# Patient Record
Sex: Male | Born: 1985 | Race: Black or African American | Hispanic: No | Marital: Single | Smoking: Current every day smoker
Health system: Southern US, Community
[De-identification: ages and names within clinical notes are randomized; demographics above are authoritative.]

## PROBLEM LIST (undated history)

## (undated) HISTORY — PX: COLOSTOMY: SHX63

---

## 2018-11-26 ENCOUNTER — Encounter (HOSPITAL_COMMUNITY): Payer: Self-pay | Admitting: Emergency Medicine

## 2018-11-26 ENCOUNTER — Other Ambulatory Visit: Payer: Self-pay

## 2018-11-26 ENCOUNTER — Emergency Department (HOSPITAL_COMMUNITY)
Admission: EM | Admit: 2018-11-26 | Discharge: 2018-11-26 | Disposition: A | Discharge status: Home / Self Care | Payer: Self-pay | Attending: Emergency Medicine | Admitting: Emergency Medicine

## 2018-11-26 DIAGNOSIS — F17210 Nicotine dependence, cigarettes, uncomplicated: Secondary | ICD-10-CM | POA: Insufficient documentation

## 2018-11-26 DIAGNOSIS — A5900 Urogenital trichomoniasis, unspecified: Secondary | ICD-10-CM | POA: Insufficient documentation

## 2018-11-26 DIAGNOSIS — Z202 Contact with and (suspected) exposure to infections with a predominantly sexual mode of transmission: Secondary | ICD-10-CM

## 2018-11-26 MED ORDER — ONDANSETRON 4 MG PO TBDP: 4.0000 mg | ORAL_TABLET | Freq: Three times a day (TID) | ORAL | 0 refills | Status: AC | PRN

## 2018-11-26 MED ORDER — METRONIDAZOLE 500 MG PO TABS
2000.0000 mg | ORAL_TABLET | Freq: Once | ORAL | Status: AC
  Administered 2018-11-26: 2000 mg via ORAL
  Filled 2018-11-26: qty 4

## 2018-11-26 NOTE — ED Triage Notes (Signed)
Pt states "my baby momma got trich and I guess I gotta get checked."  States she was dx a month ago and he is not having any symptoms.

## 2018-11-26 NOTE — ED Provider Notes (Signed)
[facility] EMERGENCY DEPARTMENT Provider Note   CSN: [id number] Arrival date & time: 11/26/18  1006    History   Chief Complaint Chief Complaint  Patient presents with  . SEXUALLY TRANSMITTED DISEASE    HPI Russell Schwartz is a 33 y.o. male.     HPI Patient presents for treatment of trichomoniasis.  States that his baby's mother has trichomoniasis and that he was sent in for treatment.  She was diagnosed a month ago when she was getting treatment for her pregnancy.  He has no symptoms.  Has had unprotected sex.  States that he does not want any further STD treatment.  No fevers or chills.  No penile discharge. History reviewed. No pertinent past medical history.  There are no active problems to display for this patient.   History reviewed. No pertinent surgical history.      Home Medications    Prior to Admission medications   Medication Sig Start Date End Date Taking? Authorizing Provider  ondansetron (ZOFRAN-ODT) 4 MG disintegrating tablet Take 1 tablet (4 mg total) by mouth every 8 (eight) hours as needed for nausea or vomiting. 11/26/18   Benjiman Core, MD    Family History History reviewed. No pertinent family history.  Social History Social History   Tobacco Use  . Smoking status: Current Every Day Smoker    Packs/day: 0.50    Types: Cigarettes  . Smokeless tobacco: Never Used  Substance Use Topics  . Alcohol use: Not Currently  . Drug use: Not Currently     Allergies   Patient has no known allergies.   Review of Systems Review of Systems  Constitutional: Negative for chills and fever.  Gastrointestinal: Negative for abdominal pain.  Endocrine: Negative for polyphagia and polyuria.  Genitourinary: Negative for discharge, penile pain and penile swelling.  Neurological: Negative for weakness.     Physical Exam Updated Vital Signs BP 131/80   Pulse 98   Temp 97.9 F (36.6 C) (Oral)   Ht 6\' 1"  (1.854 m)   Wt 99.8 kg   SpO2 100%   BMI  29.03 kg/m   Physical Exam Vitals signs and nursing note reviewed.  Constitutional:      Appearance: Normal appearance.  Cardiovascular:     Rate and Rhythm: Normal rate.  Pulmonary:     Effort: Pulmonary effort is normal.  Genitourinary:    Comments: Patient deferred penile exam. Skin:    General: Skin is warm.     Capillary Refill: Capillary refill takes less than 2 seconds.      ED Treatments / Results  Labs (all labs ordered are listed, but only abnormal results are displayed) Labs Reviewed - No data to display  EKG None  Radiology No results found.  Procedures Procedures (including critical care time)  Medications Ordered in ED Medications  metroNIDAZOLE (FLAGYL) tablet 2,000 mg (2,000 mg Oral Given 11/26/18 1028)     Initial Impression / Assessment and Plan / ED Course  I have reviewed the triage vital signs and the nursing notes.  Pertinent labs & imaging results that were available during my care of the patient were reviewed by me and considered in my medical decision making (see chart for details).        Patient with exposure to trach.  Does not want further evaluation for other STDs.  He would just like the treatment for the trich.  Patient was informed of risks of this but was given Flagyl here.  Follow-up as an outpatient.  Final Clinical Impressions(s) / ED Diagnoses   Final diagnoses:  Trichomonas exposure    ED Discharge Orders         Ordered    ondansetron (ZOFRAN-ODT) 4 MG disintegrating tablet  Every 8 hours PRN     11/26/18 1024           Benjiman CorePickering, Bradden Tadros, MD 11/26/18 1038

## 2019-04-09 ENCOUNTER — Emergency Department (HOSPITAL_COMMUNITY)
Admission: EM | Admit: 2019-04-09 | Discharge: 2019-04-09 | Disposition: A | Discharge status: Home / Self Care | Payer: Self-pay | Attending: Emergency Medicine | Admitting: Emergency Medicine

## 2019-04-09 ENCOUNTER — Emergency Department (HOSPITAL_COMMUNITY): Payer: Self-pay

## 2019-04-09 ENCOUNTER — Encounter (HOSPITAL_COMMUNITY): Payer: Self-pay

## 2019-04-09 ENCOUNTER — Other Ambulatory Visit: Payer: Self-pay

## 2019-04-09 DIAGNOSIS — R1084 Generalized abdominal pain: Secondary | ICD-10-CM | POA: Insufficient documentation

## 2019-04-09 DIAGNOSIS — J189 Pneumonia, unspecified organism: Secondary | ICD-10-CM | POA: Insufficient documentation

## 2019-04-09 DIAGNOSIS — R112 Nausea with vomiting, unspecified: Secondary | ICD-10-CM | POA: Insufficient documentation

## 2019-04-09 DIAGNOSIS — R51 Headache: Secondary | ICD-10-CM | POA: Insufficient documentation

## 2019-04-09 DIAGNOSIS — F17210 Nicotine dependence, cigarettes, uncomplicated: Secondary | ICD-10-CM | POA: Insufficient documentation

## 2019-04-09 LAB — COMPREHENSIVE METABOLIC PANEL
ALT: 29 U/L (ref 0–44)
AST: 18 U/L (ref 15–41)
Albumin: 3 g/dL — ABNORMAL LOW (ref 3.5–5.0)
Alkaline Phosphatase: 70 U/L (ref 38–126)
Anion gap: 9 (ref 5–15)
BUN: 5 mg/dL — ABNORMAL LOW (ref 6–20)
CO2: 23 mmol/L (ref 22–32)
Calcium: 9 mg/dL (ref 8.9–10.3)
Chloride: 106 mmol/L (ref 98–111)
Creatinine, Ser: 0.92 mg/dL (ref 0.61–1.24)
GFR calc Af Amer: >60 mL/min (ref >60)
GFR calc non Af Amer: >60 mL/min (ref >60)
Glucose, Bld: 113 mg/dL — ABNORMAL HIGH (ref 70–99)
Potassium: 4.1 mmol/L (ref 3.5–5.1)
Sodium: 138 mmol/L (ref 135–145)
Total Bilirubin: 0.6 mg/dL (ref 0.3–1.2)
Total Protein: 6.5 g/dL (ref 6.5–8.1)

## 2019-04-09 LAB — CBC WITH DIFFERENTIAL/PLATELET
Abs Immature Granulocytes: 0.02 10*3/uL (ref 0.00–0.07)
Basophils Absolute: 0.1 10*3/uL (ref 0.0–0.1)
Basophils Relative: 1 %
Eosinophils Absolute: 0.2 10*3/uL (ref 0.0–0.5)
Eosinophils Relative: 2 %
HCT: 29.2 % — ABNORMAL LOW (ref 39.0–52.0)
Hemoglobin: 9.5 g/dL — ABNORMAL LOW (ref 13.0–17.0)
Immature Granulocytes: 0 %
Lymphocytes Relative: 24 %
Lymphs Abs: 1.7 10*3/uL (ref 0.7–4.0)
MCH: 30.1 pg (ref 26.0–34.0)
MCHC: 32.5 g/dL (ref 30.0–36.0)
MCV: 92.4 fL (ref 80.0–100.0)
Monocytes Absolute: 0.4 10*3/uL (ref 0.1–1.0)
Monocytes Relative: 6 %
Neutro Abs: 4.8 10*3/uL (ref 1.7–7.7)
Neutrophils Relative %: 67 %
Platelets: 304 10*3/uL (ref 150–400)
RBC: 3.16 MIL/uL — ABNORMAL LOW (ref 4.22–5.81)
RDW: 13.3 % (ref 11.5–15.5)
WBC: 7.2 10*3/uL (ref 4.0–10.5)
nRBC: 0 % (ref 0.0–0.2)

## 2019-04-09 LAB — LIPASE, BLOOD: Lipase: 34 U/L (ref 11–51)

## 2019-04-09 MED ORDER — ESTROPLUS PO TABS
1.00 | ORAL_TABLET | ORAL | Status: DC
Start: ? — End: 2019-04-09

## 2019-04-09 MED ORDER — ACCU-PRO PUMP SET/VENT MISC
2.50 | Status: DC
Start: ? — End: 2019-04-09

## 2019-04-09 MED ORDER — Medication
Status: DC
Start: ? — End: 2019-04-09

## 2019-04-09 MED ORDER — BARO-CAT PO
10.00 | ORAL | Status: DC
Start: ? — End: 2019-04-09

## 2019-04-09 MED ORDER — MORPHINE SULFATE (PF) 4 MG/ML IV SOLN
4.0000 mg | Freq: Once | INTRAVENOUS | Status: AC
  Administered 2019-04-09: 11:00:00 4 mg via INTRAVENOUS
  Filled 2019-04-09: qty 1

## 2019-04-09 MED ORDER — DOXYCYCLINE HYCLATE 100 MG PO CAPS: 100.0000 mg | ORAL_CAPSULE | Freq: Two times a day (BID) | ORAL | 0 refills | Status: AC

## 2019-04-09 MED ORDER — Medication
1.00 | Status: DC
Start: 2019-04-09 — End: 2019-04-09

## 2019-04-09 MED ORDER — GENERIC EXTERNAL MEDICATION
Status: DC
Start: ? — End: 2019-04-09

## 2019-04-09 MED ORDER — Medication
500.00 | Status: DC
Start: 2019-04-09 — End: 2019-04-09

## 2019-04-09 MED ORDER — CVS EAR DROPS OT
40.00 | OTIC | Status: DC
Start: 2019-04-09 — End: 2019-04-09

## 2019-04-09 MED ORDER — CHLOROPHYLL EX
10.00 | CUTANEOUS | Status: DC
Start: ? — End: 2019-04-09
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
## 2019-04-09 NOTE — ED Notes (Signed)
Patient transported to X-ray 

## 2019-04-09 NOTE — ED Notes (Signed)
Pt given dc instructions pt verbalizes understanding.  

## 2019-04-09 NOTE — Discharge Instructions (Addendum)
Please follow-up with your general surgeon at your schedule appointment.  Continue taking your pain medication while at home.  I have prescribed antibiotics to help treat your pneumonia, please take 1 tablet twice a day for the next 7 days.  If you experience any fever, worsening symptoms please return to the emergency department.
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
## 2019-05-04 ENCOUNTER — Emergency Department (HOSPITAL_COMMUNITY)
Admission: EM | Admit: 2019-05-04 | Discharge: 2019-05-04 | Disposition: A | Discharge status: Home / Self Care | Payer: Medicaid Other | Attending: Emergency Medicine | Admitting: Emergency Medicine

## 2019-05-04 ENCOUNTER — Other Ambulatory Visit: Payer: Self-pay

## 2019-05-04 ENCOUNTER — Encounter (HOSPITAL_COMMUNITY): Payer: Self-pay | Admitting: Emergency Medicine

## 2019-05-04 DIAGNOSIS — F17210 Nicotine dependence, cigarettes, uncomplicated: Secondary | ICD-10-CM | POA: Insufficient documentation

## 2019-05-04 DIAGNOSIS — Z433 Encounter for attention to colostomy: Secondary | ICD-10-CM | POA: Insufficient documentation
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
## 2019-05-04 NOTE — ED Notes (Signed)
New bag placed and waiting on AC to bring 2 more bags for discharge.

## 2019-05-04 NOTE — ED Notes (Signed)
Patient said he could not wait on other bags because his ride was here.

## 2019-05-05 ENCOUNTER — Other Ambulatory Visit: Payer: Self-pay

## 2019-05-05 ENCOUNTER — Encounter (HOSPITAL_COMMUNITY): Payer: Self-pay | Admitting: Emergency Medicine

## 2019-05-05 ENCOUNTER — Emergency Department (HOSPITAL_COMMUNITY)
Admission: EM | Admit: 2019-05-05 | Discharge: 2019-05-05 | Disposition: A | Discharge status: Home / Self Care | Payer: Self-pay | Attending: Emergency Medicine | Admitting: Emergency Medicine

## 2019-05-05 DIAGNOSIS — Z5321 Procedure and treatment not carried out due to patient leaving prior to being seen by health care provider: Secondary | ICD-10-CM | POA: Insufficient documentation

## 2019-05-05 DIAGNOSIS — K9403 Colostomy malfunction: Secondary | ICD-10-CM | POA: Insufficient documentation

## 2019-05-05 NOTE — ED Triage Notes (Signed)
Pt presents to ED without colostomy bag. States he doesn't have any waiting for his to come from [place].

## 2019-05-09 ENCOUNTER — Emergency Department (HOSPITAL_COMMUNITY)
Admission: EM | Admit: 2019-05-09 | Discharge: 2019-05-09 | Discharge status: Against Medical Advice | Payer: Medicaid Other

## 2019-05-09 ENCOUNTER — Other Ambulatory Visit: Payer: Self-pay

## 2020-01-30 IMAGING — DX DG CHEST 2V
2 series · 2 of 2 positions shown · non-contrast
Comparison: 04/10/2019

CLINICAL DATA: Pneumonia

EXAM:
CHEST - 2 VIEW

[w chest pa]
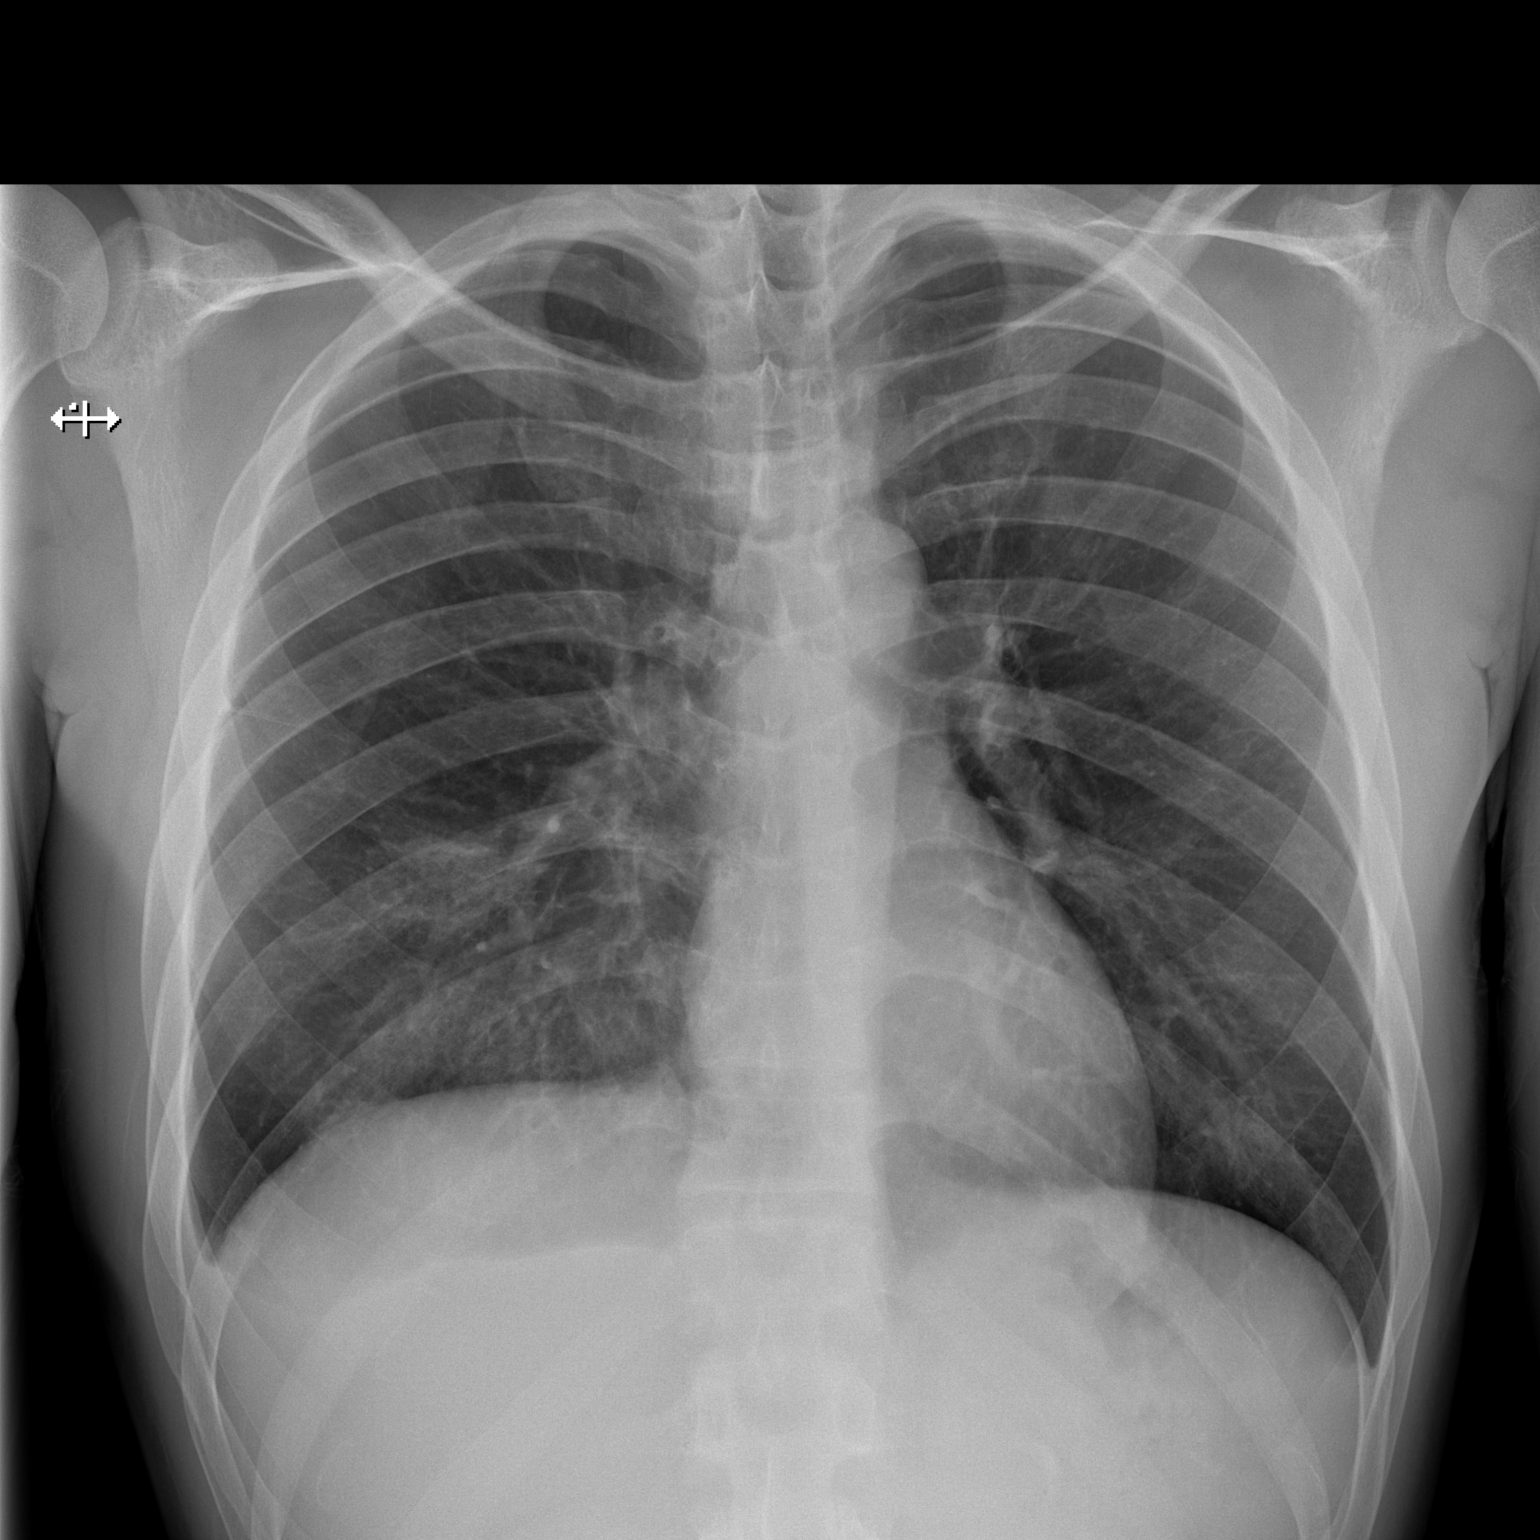

[w chest lat]
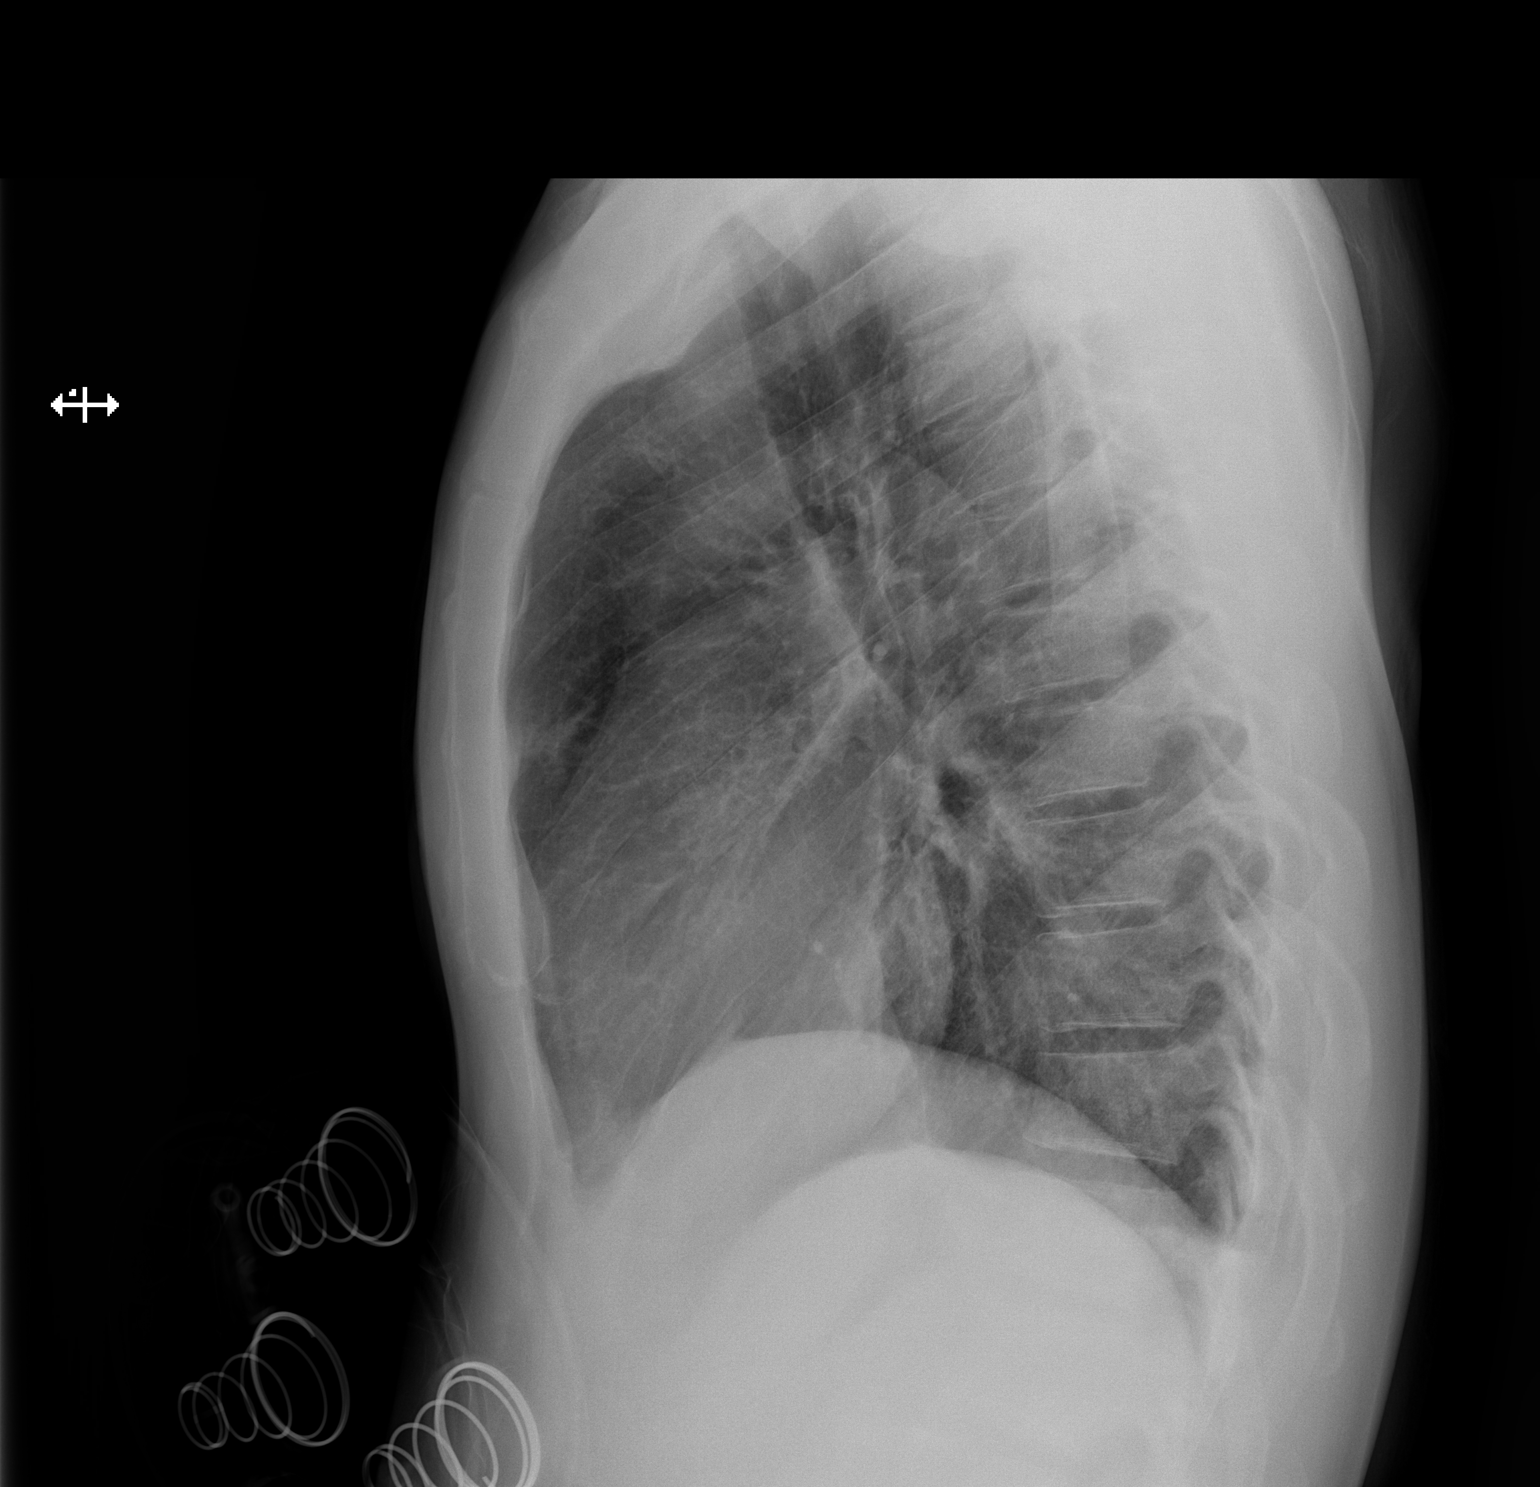

[2 of 2 positions shown; findings below may reference images not displayed]

FINDINGS: The heart size and mediastinal contours are within normal limits.
Trace right pleural effusion. There may be subtle heterogeneous
opacity of the right lung base. The visualized skeletal structures
are unremarkable. Interval resolution of previously seen
postoperative pneumoperitoneum in the upper abdomen.
IMPRESSION: Trace right pleural effusion. There may be subtle heterogeneous
opacity of the right lung base. Findings are concerning for
infection.
